# Patient Record
Sex: Male | Born: 1985 | Race: Black or African American | Hispanic: No | Marital: Single | Smoking: Never smoker
Health system: Southern US, Community
[De-identification: ages and names within clinical notes are randomized; demographics above are authoritative.]

## PROBLEM LIST (undated history)

## (undated) HISTORY — PX: MENISCUS REPAIR: SHX5179

---
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
## 2006-10-07 ENCOUNTER — Emergency Department: Payer: Self-pay | Admitting: Emergency Medicine

## 2009-08-24 ENCOUNTER — Emergency Department: Payer: Self-pay | Admitting: Emergency Medicine

## 2009-09-15 ENCOUNTER — Emergency Department: Payer: Self-pay | Admitting: Internal Medicine

## 2011-09-16 ENCOUNTER — Ambulatory Visit: Payer: Self-pay | Admitting: Surgery

## 2012-07-17 ENCOUNTER — Emergency Department: Payer: Self-pay | Admitting: Emergency Medicine

## 2016-06-05 ENCOUNTER — Encounter: Payer: Self-pay | Admitting: Emergency Medicine

## 2016-06-05 ENCOUNTER — Emergency Department
Admission: EM | Admit: 2016-06-05 | Discharge: 2016-06-05 | Disposition: A | Discharge status: Home / Self Care | Payer: Self-pay | Attending: Emergency Medicine | Admitting: Emergency Medicine

## 2016-06-05 DIAGNOSIS — T1490XD Injury, unspecified, subsequent encounter: Secondary | ICD-10-CM

## 2016-06-05 DIAGNOSIS — R234 Changes in skin texture: Secondary | ICD-10-CM | POA: Insufficient documentation

## 2016-06-05 DIAGNOSIS — Y999 Unspecified external cause status: Secondary | ICD-10-CM | POA: Insufficient documentation

## 2016-06-05 DIAGNOSIS — Y929 Unspecified place or not applicable: Secondary | ICD-10-CM | POA: Insufficient documentation

## 2016-06-05 DIAGNOSIS — X58XXXA Exposure to other specified factors, initial encounter: Secondary | ICD-10-CM | POA: Insufficient documentation

## 2016-06-05 DIAGNOSIS — S41102A Unspecified open wound of left upper arm, initial encounter: Secondary | ICD-10-CM | POA: Insufficient documentation

## 2016-06-05 DIAGNOSIS — Y939 Activity, unspecified: Secondary | ICD-10-CM | POA: Insufficient documentation

## 2016-06-05 NOTE — ED Triage Notes (Signed)
States has a scab on his shoulder that he doesn't know how it appeared but is still sore.

## 2016-06-05 NOTE — Discharge Instructions (Signed)
Continue to monitor your healing wound, and follow up with her primary care physician for the same. You may shower, but do not soak this wound. Do not pick at the scab. You may apply Neosporin or any triple antibiotic cream and a thick coat 3 times daily to help with healing and prevent infection.  Return to the emergency department if you develop fever, pus drainage, increased swelling or redness, or any other symptoms concerning to you.

## 2016-06-05 NOTE — ED Provider Notes (Signed)
[facility] Emergency Department Provider Note  ____________________________________________  Time seen: Approximately 7:12 PM  I have reviewed the triage vital signs and the nursing notes.   HISTORY  Chief Complaint Shoulder Pain    HPI Gregory Trujillo is a 30 y.o. male , otherwise healthy, nondiabetic and nonimmunocompromised, who denies IV drug abuse, presenting for slowly healing wound on the posterior aspect of the left upper arm. The patient reports that for the last 2 weeks, he has had a wound with a central black scab that has not healed. No surrounding erythema, significant tenderness, or warmth. No systemic symptoms including fever, chills, nausea or vomiting.The patient has not tried any creams or other treatments for his wound. He does not know how he got it.   No past medical history on file.  There are no active problems to display for this patient.   No past surgical history on file.    Allergies Review of patient's allergies indicates no known allergies.  No family history on file.  Social History Social History  Substance Use Topics  . Smoking status: Never Smoker  . Smokeless tobacco: Never Used  . Alcohol use Yes     Comment: weekly    Review of Systems Constitutional: No fever/chills. ENT:  No congestion or rhinorrhea. Respiratory: Denies shortness of breath.   Gastrointestinal:   No nausea, no vomiting.  No diarrhea.  No constipation. Musculoskeletal: Negative for left shoulder or left elbow pain. Skin: Positive for open wound in the left upper extremity. Neurological: Negative for headaches. No focal numbness, tingling or weakness.   10-point ROS otherwise negative.  ____________________________________________   PHYSICAL EXAM:  VITAL SIGNS: ED Triage Vitals  Enc Vitals Group     BP 06/05/16 1743 135/85     Pulse Rate 06/05/16 1743 70     Resp 06/05/16 1743 16     Temp 06/05/16 1743 98.5 F (36.9 C)   Temp Source 06/05/16 1743 Oral     SpO2 06/05/16 1743 97 %     Weight 06/05/16 1743 205 lb (93 kg)     Height 06/05/16 1743 6' (1.829 m)     Head Circumference --      Peak Flow --      Pain Score 06/05/16 1749 0     Pain Loc --      Pain Edu? --      Excl. in GC? --     Constitutional: Alert and oriented. Well appearing and in no acute distress. Answers questions appropriately. Eyes: Conjunctivae are normal.  EOMI. No scleral icterus. Head: Atraumatic. Nose: No congestion/rhinnorhea. Mouth/Throat: Mucous membranes are moist.  Neck: No stridor.  Supple.   Cardiovascular: Normal rate Respiratory: Normal respiratory effort. Musculoskeletal: Moves all extremities well  Neurologic:  A&Ox3.  Speech is clear.  Face and smile are symmetric.  EOMI.  Moves all extremities well. Skin: In the upper left upper extremity near the axilla on the posterior side, the patient has a 2 x 2 centimeter circular wound that has a central eschar with surrounding yellow granulation tissue. There is no surrounding erythema, swelling, or palpable fluctuance. There is no lymphatic streaking.  Psychiatric: Mood and affect are normal. Speech and behavior are normal.  Normal judgement.  ____________________________________________   LABS (all labs ordered are listed, but only abnormal results are displayed)  Labs Reviewed - No data to display ____________________________________________  EKG  Not indicated ____________________________________________  RADIOLOGY  No results found.  ____________________________________________   PROCEDURES  Procedure(s) performed: None  Procedures  Critical Care performed: No ____________________________________________   INITIAL IMPRESSION / ASSESSMENT AND PLAN / ED COURSE  Pertinent labs & imaging results that were available during my care of the patient were reviewed by me and considered in my medical decision making (see chart for details).  30 y.o. male  who is not immunocompromised presenting with a slowly healing wound on the posterior left arm. Overall, the patient does not have any systemic symptoms and is hemodynamically stable. His wound has a central eschar in granulation tissue, both of which are signs that is well healing. There is no evidence of surrounding cellulitis, nor any evidence of underlying abscess. The patient will be discharged home with instructions for wound care, as well as close PMD follow-up. Return precautions as well as follow-up instructions were discussed.  ____________________________________________  FINAL CLINICAL IMPRESSION(S) / ED DIAGNOSES  Final diagnoses:  Healing wound  Eschar    Clinical Course      NEW MEDICATIONS STARTED DURING THIS VISIT:  New Prescriptions   No medications on file      Rockne Menghini, MD 06/05/16 1916

## 2018-04-25 LAB — HM HIV SCREENING LAB: HM HIV Screening: NEGATIVE

## 2018-07-10 ENCOUNTER — Other Ambulatory Visit: Payer: Self-pay

## 2018-07-10 ENCOUNTER — Ambulatory Visit
Admission: EM | Admit: 2018-07-10 | Discharge: 2018-07-10 | Disposition: A | Discharge status: Home / Self Care | Payer: Self-pay | Attending: Family Medicine | Admitting: Family Medicine

## 2018-07-10 ENCOUNTER — Encounter: Payer: Self-pay | Admitting: Emergency Medicine

## 2018-07-10 DIAGNOSIS — B360 Pityriasis versicolor: Secondary | ICD-10-CM

## 2018-07-10 MED ORDER — FLUCONAZOLE 150 MG PO TABS: 300.0000 mg | ORAL_TABLET | ORAL | 0 refills | Status: DC

## 2018-07-10 NOTE — ED Triage Notes (Signed)
Pt c/o skin discoloration on his neck and left forearm. He reports that it has been on his arm for months but his neck recently started. He reports that it is mildly itchy.

## 2018-07-10 NOTE — ED Provider Notes (Signed)
[facility]    CSN: [id number] Arrival date & time: 07/10/18  0940  History   Chief Complaint Chief Complaint  Patient presents with  . Skin Discoloration   HPI  32 year old male presents with rash/skin discoloration.  Patient states that this rash/skin coloration has come about in the past month.  Affects the arms and neck.  Patient has areas that are hypopigmented as well as areas that are hyperpigmented.  No real itching.  No pain.  Is troublesome from a cosmetic perspective.  No medications or interventions tried.  No new exposures.  No other associated symptoms.  No other complaints.  PMH, Surgical Hx, Family Hx, Social History reviewed and updated as below.  PMH: Hx of knee injury.   Past Surgical History:  Procedure Laterality Date  . MENISCUS REPAIR Left    Home Medications    Prior to Admission medications   Medication Sig Start Date End Date Taking? Authorizing Provider  fluconazole (DIFLUCAN) 150 MG tablet Take 2 tablets (300 mg total) by mouth once a week. For 2 weeks. 07/10/18   Tommie Sams, DO    Family History Family History  Problem Relation Age of Onset  . Healthy Mother   . Healthy Father     Social History Social History   Tobacco Use  . Smoking status: Never Smoker  . Smokeless tobacco: Never Used  Substance Use Topics  . Alcohol use: Yes    Comment: weekly  . Drug use: Never     Allergies   Patient has no known allergies.   Review of Systems Review of Systems  Constitutional: Negative.   Skin: Positive for rash.   Physical Exam Triage Vital Signs ED Triage Vitals  Enc Vitals Group     BP 07/10/18 0955 124/81     Pulse Rate 07/10/18 0955 77     Resp 07/10/18 0955 17     Temp 07/10/18 0955 98.8 F (37.1 C)     Temp Source 07/10/18 0955 Oral     SpO2 07/10/18 0955 100 %     Weight 07/10/18 0952 230 lb (104.3 kg)     Height 07/10/18 0952 6' (1.829 m)     Head Circumference --      Peak Flow --      Pain Score  07/10/18 0952 0     Pain Loc --      Pain Edu? --      Excl. in GC? --    Updated Vital Signs BP 124/81 (BP Location: Left Arm)   Pulse 77   Temp 98.8 F (37.1 C) (Oral)   Resp 17   Ht 6' (1.829 m)   Wt 104.3 kg   SpO2 100%   BMI 31.19 kg/m   Visual Acuity Right Eye Distance:   Left Eye Distance:   Bilateral Distance:    Right Eye Near:   Left Eye Near:    Bilateral Near:     Physical Exam  Constitutional: He is oriented to person, place, and time. He appears well-developed. No distress.  HENT:  Head: Normocephalic and atraumatic.  Pulmonary/Chest: Effort normal. No respiratory distress.  Neurological: He is alert and oriented to person, place, and time.  Skin:  Areas of hypopigmentation and hyperpigmentation noted.  See picture.  Psychiatric: He has a normal mood and affect. His behavior is normal.  Nursing note and vitals reviewed.      UC Treatments / Results  Labs (all labs ordered are listed, but only  abnormal results are displayed) Labs Reviewed - No data to display  EKG None  Radiology No results found.  Procedures Procedures (including critical care time)  Medications Ordered in UC Medications - No data to display  Initial Impression / Assessment and Plan / UC Course  I have reviewed the triage vital signs and the nursing notes.  Pertinent labs & imaging results that were available during my care of the patient were reviewed by me and considered in my medical decision making (see chart for details).    32 year old male presents with rash.  Suspect tinea versicolor.  Treating with fluconazole. Final Clinical Impressions(s) / UC Diagnoses   Final diagnoses:  Tinea versicolor     Discharge Instructions     If persists or fails to resolve, see Dermatology.  Take care  Dr. Adriana Simas    ED Prescriptions    Medication Sig Dispense Auth. Provider   fluconazole (DIFLUCAN) 150 MG tablet Take 2 tablets (300 mg total) by mouth once a week. For  2 weeks. 4 tablet Tommie Sams, DO     Controlled Substance Prescriptions [place] Controlled Substance Registry consulted? Not Applicable   Tommie Sams, DO 07/10/18 1141

## 2018-07-10 NOTE — Discharge Instructions (Signed)
If persists or fails to resolve, see Dermatology.  Take care  Dr. Adriana Simas

## 2018-12-30 ENCOUNTER — Emergency Department
Admission: EM | Admit: 2018-12-30 | Discharge: 2018-12-30 | Disposition: A | Discharge status: Home / Self Care | Payer: No Typology Code available for payment source | Attending: Emergency Medicine | Admitting: Emergency Medicine

## 2018-12-30 ENCOUNTER — Other Ambulatory Visit: Payer: Self-pay

## 2018-12-30 ENCOUNTER — Emergency Department: Payer: No Typology Code available for payment source

## 2018-12-30 ENCOUNTER — Encounter: Payer: Self-pay | Admitting: Emergency Medicine

## 2018-12-30 DIAGNOSIS — Y92410 Unspecified street and highway as the place of occurrence of the external cause: Secondary | ICD-10-CM | POA: Insufficient documentation

## 2018-12-30 DIAGNOSIS — S299XXA Unspecified injury of thorax, initial encounter: Secondary | ICD-10-CM | POA: Diagnosis not present

## 2018-12-30 DIAGNOSIS — Y998 Other external cause status: Secondary | ICD-10-CM | POA: Diagnosis not present

## 2018-12-30 DIAGNOSIS — S3992XA Unspecified injury of lower back, initial encounter: Secondary | ICD-10-CM | POA: Insufficient documentation

## 2018-12-30 DIAGNOSIS — Y93I9 Activity, other involving external motion: Secondary | ICD-10-CM | POA: Insufficient documentation

## 2018-12-30 MED ORDER — MELOXICAM 15 MG PO TABS: 15.0000 mg | ORAL_TABLET | Freq: Every day | ORAL | 1 refills | Status: AC

## 2018-12-30 MED ORDER — METHOCARBAMOL 500 MG PO TABS: 500.0000 mg | ORAL_TABLET | Freq: Three times a day (TID) | ORAL | 0 refills | Status: AC | PRN
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
## 2018-12-30 NOTE — ED Notes (Signed)
Discharge instructions reviewed with patient. Questions fielded by this RN. Patient verbalizes understanding of instructions. Patient discharged home in stable condition per [facility]. No acute distress noted at time of discharge.   No peripheral IV placed this visit.

## 2019-05-06 ENCOUNTER — Ambulatory Visit: Payer: Self-pay

## 2019-05-09 ENCOUNTER — Ambulatory Visit: Payer: Self-pay | Admitting: Physician Assistant

## 2019-05-09 ENCOUNTER — Other Ambulatory Visit: Payer: Self-pay

## 2019-05-09 ENCOUNTER — Encounter: Payer: Self-pay | Admitting: Physician Assistant

## 2019-05-09 DIAGNOSIS — Z113 Encounter for screening for infections with a predominantly sexual mode of transmission: Secondary | ICD-10-CM

## 2019-05-09 LAB — GRAM STAIN

## 2019-05-09 NOTE — Progress Notes (Signed)
    STI clinic/screening visit  Subjective:  Gregory Trujillo is a 33 y.o. male being seen today for an STI screening visit. The patient reports they do not have symptoms.  Patient has the following medical conditions:  There are no active problems to display for this patient.    Chief Complaint  Patient presents with  . SEXUALLY TRANSMITTED DISEASE    HPI  Patient reports that he would like a screening today.  Denies any symptoms.  See flowsheet for further details and programmatic requirements.    The following portions of the patient's history were reviewed and updated as appropriate: allergies, current medications, past medical history, past social history, past surgical history and problem list.  Objective:  There were no vitals filed for this visit.  Physical Exam Constitutional:      General: He is not in acute distress.    Appearance: Normal appearance.  HENT:     Head: Normocephalic and atraumatic.     Mouth/Throat:     Mouth: Mucous membranes are moist.     Pharynx: Oropharynx is clear. No oropharyngeal exudate or posterior oropharyngeal erythema.  Eyes:     Conjunctiva/sclera: Conjunctivae normal.  Neck:     Musculoskeletal: Neck supple.  Pulmonary:     Effort: Pulmonary effort is normal.  Abdominal:     Palpations: Abdomen is soft. There is no mass.     Tenderness: There is no abdominal tenderness. There is no guarding or rebound.  Genitourinary:    Penis: Normal.      Scrotum/Testes: Normal.     Comments: Pubic area without nits, lice, edema, erythema, lesions and inguinal adenopathy. Penis uncircumcised and without any discharge at meatus. Lymphadenopathy:     Cervical: No cervical adenopathy.  Skin:    General: Skin is warm and dry.     Findings: No bruising, erythema, lesion or rash.  Neurological:     Mental Status: He is alert and oriented to person, place, and time.  Psychiatric:        Mood and Affect: Mood normal.        Behavior:  Behavior normal.        Thought Content: Thought content normal.        Judgment: Judgment normal.       Assessment and Plan:  Gregory Trujillo is a 33 y.o. male presenting to the [facility] Department for STI screening  1. Screening for STD (sexually transmitted disease) Patient is without any symptoms today.  Gram stain is negative and no treatment is indicated.  Rec condoms with all sex Await test results.  Counseled that RN will call if needs to RTC for any treatment once results are back.  - Gram stain - Gonococcus culture - HIV [place] [facility] - Syphilis Serology, [place] [facility] - Gonococcus culture     No follow-ups on file.  No future appointments.  Jerene Dilling, PA

## 2019-05-13 LAB — GONOCOCCUS CULTURE

## 2019-05-30 ENCOUNTER — Other Ambulatory Visit: Payer: Self-pay

## 2019-05-30 DIAGNOSIS — Z20822 Contact with and (suspected) exposure to covid-19: Secondary | ICD-10-CM

## 2019-05-31 LAB — NOVEL CORONAVIRUS, NAA: SARS-CoV-2, NAA: NOT DETECTED

## 2019-08-14 ENCOUNTER — Other Ambulatory Visit: Payer: Self-pay | Admitting: *Deleted

## 2019-08-14 DIAGNOSIS — Z20822 Contact with and (suspected) exposure to covid-19: Secondary | ICD-10-CM

## 2019-08-15 LAB — NOVEL CORONAVIRUS, NAA: SARS-CoV-2, NAA: NOT DETECTED

## 2019-11-19 ENCOUNTER — Other Ambulatory Visit: Payer: Self-pay

## 2019-11-19 ENCOUNTER — Ambulatory Visit: Payer: Self-pay | Admitting: Physician Assistant

## 2019-11-19 DIAGNOSIS — Z113 Encounter for screening for infections with a predominantly sexual mode of transmission: Secondary | ICD-10-CM

## 2019-11-19 LAB — GRAM STAIN

## 2019-11-20 ENCOUNTER — Encounter: Payer: Self-pay | Admitting: Physician Assistant

## 2019-11-20 NOTE — Progress Notes (Signed)
   [facility] Department STI clinic/screening visit  Subjective:  Gregory Trujillo is a 34 y.o. male being seen today for an STI screening visit. The patient reports they do not have symptoms.    Patient has the following medical conditions:  There are no problems to display for this patient.    Chief Complaint  Patient presents with  . SEXUALLY TRANSMITTED DISEASE    HPI  Patient reports that he does not have any symptoms but would like a screening today.  Using condoms sometimes.   See flowsheet for further details and programmatic requirements.    The following portions of the patient's history were reviewed and updated as appropriate: allergies, current medications, past medical history, past social history, past surgical history and problem list.  Objective:  There were no vitals filed for this visit.  Physical Exam Constitutional:      General: He is not in acute distress.    Appearance: Normal appearance.  HENT:     Head: Normocephalic and atraumatic.     Comments: No nits, lice, or hair loss. No cervical, supraclavicular or axillary adenopathy.    Mouth/Throat:     Mouth: Mucous membranes are moist.     Pharynx: Oropharynx is clear. No oropharyngeal exudate or posterior oropharyngeal erythema.  Eyes:     Conjunctiva/sclera: Conjunctivae normal.  Pulmonary:     Effort: Pulmonary effort is normal.  Abdominal:     Palpations: Abdomen is soft. There is no mass.     Tenderness: There is no abdominal tenderness. There is no guarding or rebound.  Genitourinary:    Penis: Normal.      Testes: Normal.     Comments: Pubic area without nits, lice, edema, erythema, lesions and inguinal adenopathy. Penis circumcised, without rash or lesions and discharge at meatus. Musculoskeletal:     Cervical back: Neck supple. No tenderness.  Skin:    General: Skin is warm and dry.     Findings: No bruising, erythema, lesion or rash.  Neurological:     Mental  Status: He is alert and oriented to person, place, and time.  Psychiatric:        Mood and Affect: Mood normal.        Behavior: Behavior normal.        Thought Content: Thought content normal.        Judgment: Judgment normal.       Assessment and Plan:  Gregory Trujillo is a 34 y.o. male presenting to the [facility] Department for STI screening  1. Screening for STD (sexually transmitted disease) Patient into clinic without symptoms. Rec condoms with all sex. Await test results.  Counseled that RN will call if needs to RTC for treatment once results are back. - Gram stain - Gonococcus culture - HIV [place] [facility] - Syphilis Serology, [facility]     No follow-ups on file.  No future appointments.  Matt Holmes, PA

## 2019-11-23 LAB — GONOCOCCUS CULTURE

## 2020-03-24 ENCOUNTER — Ambulatory Visit: Payer: Self-pay | Admitting: Physician Assistant

## 2020-03-24 ENCOUNTER — Other Ambulatory Visit: Payer: Self-pay

## 2020-03-24 DIAGNOSIS — Z113 Encounter for screening for infections with a predominantly sexual mode of transmission: Secondary | ICD-10-CM

## 2020-03-24 LAB — GRAM STAIN

## 2020-03-25 ENCOUNTER — Encounter: Payer: Self-pay | Admitting: Physician Assistant

## 2020-03-25 NOTE — Progress Notes (Signed)
   [facility] Department STI clinic/screening visit  Subjective:  Gregory Trujillo is a 34 y.o. male being seen today for an STI screening visit. The patient reports they do not have symptoms.    Patient has the following medical conditions:  There are no problems to display for this patient.    Chief Complaint  Patient presents with  . SEXUALLY TRANSMITTED DISEASE    screening    HPI  Patient reports that he does not have any symptoms but would like a screening today.  Denies chronic conditions, surgeries and regular medicines.  States last HIV testing was in March of this year.   See flowsheet for further details and programmatic requirements.    The following portions of the patient's history were reviewed and updated as appropriate: allergies, current medications, past medical history, past social history, past surgical history and problem list.  Objective:  There were no vitals filed for this visit.  Physical Exam Constitutional:      General: He is not in acute distress.    Appearance: Normal appearance.  HENT:     Head: Normocephalic and atraumatic.     Comments: No nits, lice, or hair loss. No cervical, supraclavicular or axillary adenopathy.    Mouth/Throat:     Mouth: Mucous membranes are moist.     Pharynx: Oropharynx is clear. No oropharyngeal exudate or posterior oropharyngeal erythema.  Eyes:     Conjunctiva/sclera: Conjunctivae normal.  Pulmonary:     Effort: Pulmonary effort is normal.  Abdominal:     Palpations: Abdomen is soft. There is no mass.     Tenderness: There is no abdominal tenderness. There is no guarding or rebound.  Genitourinary:    Penis: Normal.      Testes: Normal.     Comments: Pubic area without nits, lice, edema, erythema, lesions and inguinal adenopathy. Penis circumcised without rash, lesion and discharge at meatus. Musculoskeletal:     Cervical back: Neck supple. No tenderness.  Skin:    General: Skin is warm  and dry.     Findings: No bruising, erythema, lesion or rash.  Neurological:     Mental Status: He is alert and oriented to person, place, and time.  Psychiatric:        Mood and Affect: Mood normal.        Behavior: Behavior normal.        Thought Content: Thought content normal.        Judgment: Judgment normal.       Assessment and Plan:  Gregory Trujillo is a 33 y.o. male presenting to the [facility] Department for STI screening  1. Screening for STD (sexually transmitted disease) Patient into clinic without symptoms.  Reviewed exam and Gram stain findings with patient and no indication for treatment today.  Rec condoms with all sex. Await test results.  Counseled that RN will call if needs to RTC for treatment once results are back. - Gram stain - Gonococcus culture - HIV/HCV [facility] - Syphilis Serology, [facility] - Gonococcus culture     No follow-ups on file.  No future appointments.  Matt Holmes, PA

## 2020-03-27 LAB — HM HEPATITIS C SCREENING LAB: HM Hepatitis Screen: NEGATIVE

## 2020-03-27 LAB — HM HIV SCREENING LAB: HM HIV Screening: NEGATIVE

## 2020-03-29 LAB — GONOCOCCUS CULTURE

## 2020-03-31 ENCOUNTER — Encounter: Payer: Self-pay | Admitting: Family Medicine

## 2020-06-14 ENCOUNTER — Emergency Department
Admission: EM | Admit: 2020-06-14 | Discharge: 2020-06-14 | Disposition: A | Discharge status: Home / Self Care | Payer: Self-pay | Attending: Emergency Medicine | Admitting: Emergency Medicine

## 2020-06-14 ENCOUNTER — Encounter: Payer: Self-pay | Admitting: Emergency Medicine

## 2020-06-14 ENCOUNTER — Other Ambulatory Visit: Payer: Self-pay

## 2020-06-14 DIAGNOSIS — B360 Pityriasis versicolor: Secondary | ICD-10-CM | POA: Insufficient documentation

## 2020-06-14 DIAGNOSIS — K2921 Alcoholic gastritis with bleeding: Secondary | ICD-10-CM | POA: Insufficient documentation

## 2020-06-14 LAB — COMPREHENSIVE METABOLIC PANEL
ALT: 30 U/L (ref 0–44)
AST: 23 U/L (ref 15–41)
Albumin: 4.3 g/dL (ref 3.5–5.0)
Alkaline Phosphatase: 44 U/L (ref 38–126)
Anion gap: 11 (ref 5–15)
BUN: 14 mg/dL (ref 6–20)
CO2: 26 mmol/L (ref 22–32)
Calcium: 9.4 mg/dL (ref 8.9–10.3)
Chloride: 101 mmol/L (ref 98–111)
Creatinine, Ser: 1.19 mg/dL (ref 0.61–1.24)
GFR, Estimated: >60 mL/min (ref >60)
Glucose, Bld: 99 mg/dL (ref 70–99)
Potassium: 3.6 mmol/L (ref 3.5–5.1)
Sodium: 138 mmol/L (ref 135–145)
Total Bilirubin: 1.2 mg/dL (ref 0.3–1.2)
Total Protein: 7.4 g/dL (ref 6.5–8.1)

## 2020-06-14 LAB — CBC
HCT: 47.6 % (ref 39.0–52.0)
Hemoglobin: 16.8 g/dL (ref 13.0–17.0)
MCH: 28.5 pg (ref 26.0–34.0)
MCHC: 35.3 g/dL (ref 30.0–36.0)
MCV: 80.7 fL (ref 80.0–100.0)
Platelets: 204 10*3/uL (ref 150–400)
RBC: 5.9 MIL/uL — ABNORMAL HIGH (ref 4.22–5.81)
RDW: 13.8 % (ref 11.5–15.5)
WBC: 6.6 10*3/uL (ref 4.0–10.5)
nRBC: 0 % (ref 0.0–0.2)

## 2020-06-14 LAB — URINALYSIS, COMPLETE (UACMP) WITH MICROSCOPIC
Bacteria, UA: NONE SEEN
Bilirubin Urine: NEGATIVE
Glucose, UA: NEGATIVE mg/dL
Hgb urine dipstick: NEGATIVE
Ketones, ur: NEGATIVE mg/dL
Leukocytes,Ua: NEGATIVE
Nitrite: NEGATIVE
Protein, ur: NEGATIVE mg/dL
Specific Gravity, Urine: 1.03 (ref 1.005–1.030)
Squamous Epithelial / HPF: NONE SEEN (ref 0–5)
pH: 5 (ref 5.0–8.0)

## 2020-06-14 LAB — LIPASE, BLOOD: Lipase: 27 U/L (ref 11–51)

## 2020-06-14 MED ORDER — DICYCLOMINE HCL 10 MG PO CAPS: 10.0000 mg | ORAL_CAPSULE | Freq: Four times a day (QID) | ORAL | 0 refills | Status: DC

## 2020-06-14 MED ORDER — OMEPRAZOLE 10 MG PO CPDR: 10.0000 mg | DELAYED_RELEASE_CAPSULE | Freq: Every day | ORAL | 0 refills | Status: DC

## 2020-06-14 MED ORDER — FLUCONAZOLE 150 MG PO TABS: 150.0000 mg | ORAL_TABLET | ORAL | 0 refills | Status: DC

## 2020-06-14 MED ORDER — KETOCONAZOLE 2 % EX CREA: 1.0000 "application " | TOPICAL_CREAM | Freq: Every day | CUTANEOUS | 1 refills | Status: AC

## 2020-06-14 NOTE — ED Triage Notes (Signed)
Pt presents to ED via POV, pt states has been having diarrhea and dark blood in his stools since Friday after having several drinks. Pt also states that he believes he is allergic to something due to having a rash on his skin, pt with dark brown dry patches on his arms. Pt A&O x4, VSS in triage.

## 2020-06-14 NOTE — ED Provider Notes (Signed)
[facility] Emergency Department Provider Note  ____________________________________________  Time seen: Approximately 7:34 PM  I have reviewed the triage vital signs and the nursing notes.   HISTORY  Chief Complaint Abdominal Pain and Rash    HPI Gregory Trujillo is a 34 y.o. male who presents the emergency department complaining of 2 separate complaints.  Patient's primary complaint is diarrhea, specks of blood in his stool.  Patient states that he drinks "a lot" of liquor.  Patient states that he had an exceptionally heavy night of drinking 2 days ago.  He has had some upset stomach, diarrhea and seen some "specks" of blood in his stool.  No frank bleeding.  Patient denies any significant abdominal pain.  Patient was concerned after seeing the blood.  No history of GI bleeding.  No history of gastritis or ulcers.  No bleeding or clotting disorders.  No fevers or chills.  No emesis.  Patient is also complaining of return of tinea versicolor.  Patient states that 2 years he was treated successfully with Diflucan.  He states that he has had a return of the rash to his neck, bilateral arms.  Patient is requesting medication to clear up the rash.         History reviewed. No pertinent past medical history.  There are no problems to display for this patient.   Past Surgical History:  Procedure Laterality Date  . MENISCUS REPAIR Left     Prior to Admission medications   Medication Sig Start Date End Date Taking? Authorizing Provider  dicyclomine (BENTYL) 10 MG capsule Take 1 capsule (10 mg total) by mouth 4 (four) times daily for 14 days. 06/14/20 06/28/20  Madysen Faircloth, Delorise Royals, PA-C  fluconazole (DIFLUCAN) 150 MG tablet Take 1 tablet (150 mg total) by mouth once a week. 06/14/20   Adhvik Canady, Delorise Royals, PA-C  ketoconazole (NIZORAL) 2 % cream Apply 1 application topically daily for 21 days. 06/14/20 07/05/20  Ghassan Coggeshall, Delorise Royals, PA-C  omeprazole  (PRILOSEC) 10 MG capsule Take 1 capsule (10 mg total) by mouth daily. 06/14/20   Brisia Schuermann, Delorise Royals, PA-C    Allergies Patient has no known allergies.  Family History  Problem Relation Age of Onset  . Healthy Mother   . Healthy Father     Social History Social History   Tobacco Use  . Smoking status: Never Smoker  . Smokeless tobacco: Never Used  Vaping Use  . Vaping Use: Never used  Substance Use Topics  . Alcohol use: Yes    Comment: weekly  . Drug use: Never     Review of Systems  Constitutional: No fever/chills Eyes: No visual changes. No discharge ENT: No upper respiratory complaints. Cardiovascular: no chest pain. Respiratory: no cough. No SOB. Gastrointestinal: No abdominal pain.  No nausea, no vomiting.  Positive for diarrhea with specks of blood..  No constipation. Musculoskeletal: Negative for musculoskeletal pain. Skin: Positive for return of tinea versicolor Neurological: Negative for headaches, focal weakness or numbness. 10-point ROS otherwise negative.  ____________________________________________   PHYSICAL EXAM:  VITAL SIGNS: ED Triage Vitals [06/14/20 1623]  Enc Vitals Group     BP 132/89     Pulse Rate 74     Resp 20     Temp 98.4 F (36.9 C)     Temp Source Oral     SpO2 99 %     Weight 240 lb (108.9 kg)     Height 6' (1.829 m)     Head Circumference  Peak Flow      Pain Score 8     Pain Loc      Pain Edu?      Excl. in GC?      Constitutional: Alert and oriented. Well appearing and in no acute distress. Eyes: Conjunctivae are normal. PERRL. EOMI. Head: Atraumatic. ENT:      Ears:       Nose: No congestion/rhinnorhea.      Mouth/Throat: Mucous membranes are moist.  Neck: No stridor.    Cardiovascular: Normal rate, regular rhythm. Normal S1 and S2.  Good peripheral circulation. Respiratory: Normal respiratory effort without tachypnea or retractions. Lungs CTAB. Good air entry to the bases with no decreased or absent  breath sounds. Gastrointestinal: No external abdominal wall findings.  Bowel sounds 4 quadrants all quadrants.  . Soft and nontender to palpation. No tenderness specifically in the epigastric region.No guarding or rigidity. No palpable masses. No distention. No CVA tenderness. Musculoskeletal: Full range of motion to all extremities. No gross deformities appreciated. Neurologic:  Normal speech and language. No gross focal neurologic deficits are appreciated.  Skin:  Skin is warm, dry and intact.  Patient has a dry, patchy rash consistent with tinea versicolor noted to bilateral arms, posterior neck.  No erythema or edema concerning for cellulitis.  No fluctuance concerning for abscess.  No hives or urticaria. Psychiatric: Mood and affect are normal. Speech and behavior are normal. Patient exhibits appropriate insight and judgement.   ____________________________________________   LABS (all labs ordered are listed, but only abnormal results are displayed)  Labs Reviewed  CBC - Abnormal; Notable for the following components:      Result Value   RBC 5.90 (*)    All other components within normal limits  URINALYSIS, COMPLETE (UACMP) WITH MICROSCOPIC - Abnormal; Notable for the following components:   Color, Urine YELLOW (*)    APPearance CLEAR (*)    All other components within normal limits  LIPASE, BLOOD  COMPREHENSIVE METABOLIC PANEL   ____________________________________________  EKG   ____________________________________________  RADIOLOGY   No results found.  ____________________________________________    PROCEDURES  Procedure(s) performed:    Procedures    Medications - No data to display   ____________________________________________   INITIAL IMPRESSION / ASSESSMENT AND PLAN / ED COURSE  Pertinent labs & imaging results that were available during my care of the patient were reviewed by me and considered in my medical decision making (see chart for  details).  Review of the [place] CSRS was performed in accordance of the [facility] prior to dispensing any controlled drugs.           Patient's diagnosis is consistent with tinea versicolor, alcoholic gastritis.  Patient presented to the emergency department 2 separate complaints.  Patient has had diarrhea, specks of blood after drinking large amount of alcohol 2 days ago.  Labs are reassuring.  Abdominal exam is benign with no significant tenderness.  I have a low suspicion for perforation or ulcers at this time.  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  At this time no indication for imaging.  I will treat the patient with Bentyl and omeprazole for symptom relief.  I have encouraged the patient to decrease alcohol consumption for well to allow improvement of symptoms.  Patient developed sudden abdominal pain, frank GI bleeding he should return to the emergency department for reevaluation.  Patient has findings consistent with tinea versicolor as well.  Patient has been treated in  the past.  Treat with ketoconazole topical as well as weekly Diflucan for 6 weeks.  Follow-up with dermatology if symptoms do not improve.  Follow-up with GI if patient has ongoing GI complaints.. Patient is given ED precautions to return to the ED for any worsening or new symptoms.     ____________________________________________  FINAL CLINICAL IMPRESSION(S) / ED DIAGNOSES  Final diagnoses:  Acute alcoholic gastritis with hemorrhage  Tinea versicolor      NEW MEDICATIONS STARTED DURING THIS VISIT:  ED Discharge Orders         Ordered    omeprazole (PRILOSEC) 10 MG capsule  Daily        06/14/20 2055    dicyclomine (BENTYL) 10 MG capsule  4 times daily        06/14/20 2055    fluconazole (DIFLUCAN) 150 MG tablet  Weekly        06/14/20 2055    ketoconazole (NIZORAL) 2 % cream  Daily        06/14/20 2055              This chart was dictated  using voice recognition software/Dragon. Despite best efforts to proofread, errors can occur which can change the meaning. Any change was purely unintentional.    Racheal Patches, PA-C 06/14/20 2103    Phineas Semen, MD 06/14/20 2206

## 2020-06-15 ENCOUNTER — Encounter: Payer: Self-pay | Admitting: Family Medicine

## 2020-06-15 ENCOUNTER — Ambulatory Visit: Payer: Self-pay | Admitting: Family Medicine

## 2020-06-15 DIAGNOSIS — Z113 Encounter for screening for infections with a predominantly sexual mode of transmission: Secondary | ICD-10-CM

## 2020-06-15 LAB — GRAM STAIN

## 2020-06-15 NOTE — Progress Notes (Signed)
   [facility] Department STI clinic/screening visit  Subjective:  Gregory Trujillo is a 34 y.o. male being seen today for an STI screening visit. The patient reports they do not have symptoms.    Patient has the following medical conditions:  There are no problems to display for this patient.    Chief Complaint  Patient presents with  . SEXUALLY TRANSMITTED DISEASE    screening     HPI  Patient reports he does not have symptoms and just want a check up.  Denies any partner with symptoms.   See flowsheet for further details and programmatic requirements.   The following portions of the patient's history were reviewed and updated as appropriate: allergies, current medications, past medical history, past social history, past surgical history and problem list.  Objective:  There were no vitals filed for this visit.  Physical Exam HENT:     Head:     Comments: In scalp, brows and lashes: no nits, no hair loss    Mouth/Throat:     Mouth: Mucous membranes are moist.     Pharynx: Oropharynx is clear. No oropharyngeal exudate or posterior oropharyngeal erythema.  Genitourinary:    Comments: No lice, nits, or pest, no lesions or odor discharge.  Denies pain or tenderness with paplation of testicles.  No lesions, ulcers or masses present.   Musculoskeletal:     Cervical back: Normal range of motion and neck supple.  Lymphadenopathy:     Cervical: No cervical adenopathy.  Skin:    General: Skin is warm and dry.     Findings: No bruising, erythema, lesion or rash.     Comments: Patient has hyperpigmented areas on neck,in fold of arms, and back- being treated for tinea versicolor.   Psychiatric:        Mood and Affect: Mood normal.        Behavior: Behavior normal.       Assessment and Plan:  [name] is a 34 y.o. male presenting to the [facility] Department for STI screening  1. Screening examination for venereal disease - Gram stain -  Gonococcus culture - Gonococcus culture   Patient does not have STI symptoms Patient accepted all screenings including gram stain , GC for urethral and pharynx  and declines bloodwork today.   Patient meets criteria for HepB screening? No. Ordered? No - does not meet critera  Patient meets criteria for HepC screening? No. Ordered? No - does not meet critera  Recommended condom use with all sex Discussed importance of condom use for STI prevention, condoms declined today.    Per gram stain no treatment needed today.  Discussed time line for [facility] results and that patient will be called with positive results and encouraged patient to call if he had not heard in 2 weeks.  Recommended returning for continued or worsening symptoms.     No follow-ups on file.  No future appointments.  Wendi Snipes, RN

## 2020-06-17 NOTE — Progress Notes (Signed)
Reviewed ERRN documentation for flowsheet and exam note.  Agree that documentation meets STD program requirements/guidelines and standing orders.  Will sign sign since ERRN not cleared to sign documents yet. 

## 2020-06-20 LAB — GONOCOCCUS CULTURE

## 2020-08-24 ENCOUNTER — Other Ambulatory Visit: Payer: Self-pay

## 2020-08-24 DIAGNOSIS — Z20822 Contact with and (suspected) exposure to covid-19: Secondary | ICD-10-CM

## 2020-08-25 LAB — SARS-COV-2, NAA 2 DAY TAT

## 2020-08-25 LAB — NOVEL CORONAVIRUS, NAA: SARS-CoV-2, NAA: NOT DETECTED

## 2020-12-22 ENCOUNTER — Other Ambulatory Visit: Payer: Self-pay

## 2020-12-22 ENCOUNTER — Ambulatory Visit (INDEPENDENT_AMBULATORY_CARE_PROVIDER_SITE_OTHER): Payer: Self-pay

## 2020-12-22 ENCOUNTER — Ambulatory Visit
Admission: EM | Admit: 2020-12-22 | Discharge: 2020-12-22 | Disposition: A | Discharge status: Home / Self Care | Payer: Self-pay | Attending: Emergency Medicine | Admitting: Emergency Medicine

## 2020-12-22 DIAGNOSIS — M7541 Impingement syndrome of right shoulder: Secondary | ICD-10-CM

## 2020-12-22 DIAGNOSIS — M25511 Pain in right shoulder: Secondary | ICD-10-CM

## 2020-12-22 MED ORDER — PREDNISONE 10 MG (21) PO TBPK: ORAL_TABLET | ORAL | 0 refills | Status: AC
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
## 2020-12-22 NOTE — Discharge Instructions (Signed)
Starting tomorrow morning take the prednisone according to the package instructions to help with inflammation in your right shoulder.  Follow the rehabilitation exercises given in your discharge paperwork.  I have made a referral to [facility] and they will be calling you to schedule an appointment.
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
## 2020-12-29 ENCOUNTER — Ambulatory Visit: Payer: Self-pay | Admitting: Orthopaedic Surgery

## 2021-01-20 ENCOUNTER — Ambulatory Visit: Payer: Self-pay

## 2021-09-11 ENCOUNTER — Other Ambulatory Visit: Payer: Self-pay

## 2021-09-11 ENCOUNTER — Emergency Department
Admission: EM | Admit: 2021-09-11 | Discharge: 2021-09-11 | Disposition: A | Discharge status: Home / Self Care | Payer: Self-pay | Attending: Emergency Medicine | Admitting: Emergency Medicine

## 2021-09-11 ENCOUNTER — Emergency Department: Payer: Self-pay

## 2021-09-11 DIAGNOSIS — Z23 Encounter for immunization: Secondary | ICD-10-CM | POA: Insufficient documentation

## 2021-09-11 DIAGNOSIS — S62633B Displaced fracture of distal phalanx of left middle finger, initial encounter for open fracture: Secondary | ICD-10-CM | POA: Insufficient documentation

## 2021-09-11 DIAGNOSIS — W3400XA Accidental discharge from unspecified firearms or gun, initial encounter: Secondary | ICD-10-CM | POA: Insufficient documentation

## 2021-09-11 MED ORDER — BACITRACIN ZINC 500 UNIT/GM EX OINT: TOPICAL_OINTMENT | Freq: Once | CUTANEOUS | Status: AC

## 2021-09-11 MED ORDER — OXYCODONE-ACETAMINOPHEN 5-325 MG PO TABS: 2.0000 | ORAL_TABLET | Freq: Four times a day (QID) | ORAL | 0 refills | Status: AC | PRN

## 2021-09-11 MED ORDER — ONDANSETRON 4 MG PO TBDP
4.0000 mg | ORAL_TABLET | Freq: Once | ORAL | Status: AC
  Administered 2021-09-11: 4 mg via ORAL
  Filled 2021-09-11: qty 1

## 2021-09-11 MED ORDER — CEPHALEXIN 500 MG PO CAPS: 500.0000 mg | ORAL_CAPSULE | Freq: Four times a day (QID) | ORAL | 0 refills | Status: AC

## 2021-09-11 MED ORDER — TETANUS-DIPHTH-ACELL PERTUSSIS 5-2.5-18.5 LF-MCG/0.5 IM SUSY
0.5000 mL | PREFILLED_SYRINGE | Freq: Once | INTRAMUSCULAR | Status: AC
  Administered 2021-09-11: 0.5 mL via INTRAMUSCULAR
  Filled 2021-09-11: qty 0.5

## 2021-09-11 MED ORDER — CEPHALEXIN 500 MG PO CAPS
500.0000 mg | ORAL_CAPSULE | Freq: Once | ORAL | Status: AC
  Administered 2021-09-11: 500 mg via ORAL
  Filled 2021-09-11: qty 1

## 2021-09-11 MED ORDER — ONDANSETRON 4 MG PO TBDP: 4.0000 mg | ORAL_TABLET | Freq: Four times a day (QID) | ORAL | 0 refills | Status: AC | PRN

## 2021-09-11 MED ORDER — IBUPROFEN 800 MG PO TABS: 800.0000 mg | ORAL_TABLET | Freq: Three times a day (TID) | ORAL | 0 refills | Status: AC | PRN

## 2021-09-11 MED ORDER — OXYCODONE-ACETAMINOPHEN 5-325 MG PO TABS
2.0000 | ORAL_TABLET | Freq: Once | ORAL | Status: AC
  Administered 2021-09-11: 2 via ORAL
  Filled 2021-09-11: qty 2
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
## 2021-09-11 NOTE — Discharge Instructions (Addendum)
You will need to see orthopedic surgery as an outpatient for this injury.  Please keep your wound clean and dry and a dressing on at all times.  Unfortunately at this time there is no tissue that can be sutured from the ED.  We did discuss your case with Dr. Roland Rack on call with orthopedics who is reviewed pictures of your injury as well as x-rays and recommends close outpatient follow-up, antibiotics, pain control.  He recommends very close follow-up with a hand surgeon.  I have provided you with information for hand surgeons in [place], [place] and [place].  If you begin having fever of 100.4 or higher, uncontrolled pain, bleeding from your wound that will not stop with holding direct pressure for 30 minutes, drainage of pus, redness that streaks up your hand and arm, any other symptom concerning to you, please return to the emergency department.   You are being provided a prescription for opiates (also known as narcotics) for pain control.  Opiates can be addictive and should only be used when absolutely necessary for pain control when other alternatives do not work.  We recommend you only use them for the recommended amount of time and only as prescribed.  Please do not take with other sedative medications or alcohol.  Please do not drive, operate machinery, make important decisions while taking opiates.  Please note that these medications can be addictive and have high abuse potential.  Patients can become addicted to narcotics after only taking them for a few days.  Please keep these medications locked away from children, teenagers or any family members with history of substance abuse.  Narcotic pain medicine may also make you constipated.  You may use over-the-counter medications such as MiraLAX, Colace to prevent constipation.  If you become constipated you may use over-the-counter enemas as needed.  Itching and nausea are common side effects of narcotic pain medication.  If you develop uncontrolled  vomiting or a rash, please stop these medications.
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
## 2021-09-11 NOTE — ED Notes (Signed)
Antibiotic oint, non stick dressing applied with bulky gauze over nonsitck.

## 2021-09-11 NOTE — ED Notes (Signed)
BPD here to speak with pt.

## 2021-09-27 ENCOUNTER — Ambulatory Visit: Payer: Self-pay

## 2022-05-19 ENCOUNTER — Emergency Department: Payer: Self-pay

## 2022-05-19 ENCOUNTER — Encounter: Payer: Self-pay | Admitting: Intensive Care

## 2022-05-19 ENCOUNTER — Emergency Department
Admission: EM | Admit: 2022-05-19 | Discharge: 2022-05-19 | Disposition: A | Discharge status: Home / Self Care | Payer: Self-pay | Attending: Emergency Medicine | Admitting: Emergency Medicine

## 2022-05-19 ENCOUNTER — Other Ambulatory Visit: Payer: Self-pay

## 2022-05-19 DIAGNOSIS — W3400XA Accidental discharge from unspecified firearms or gun, initial encounter: Secondary | ICD-10-CM | POA: Insufficient documentation

## 2022-05-19 DIAGNOSIS — S41102A Unspecified open wound of left upper arm, initial encounter: Secondary | ICD-10-CM | POA: Insufficient documentation

## 2022-05-19 DIAGNOSIS — R791 Abnormal coagulation profile: Secondary | ICD-10-CM | POA: Insufficient documentation

## 2022-05-19 DIAGNOSIS — S51012A Laceration without foreign body of left elbow, initial encounter: Secondary | ICD-10-CM | POA: Insufficient documentation

## 2022-05-19 DIAGNOSIS — Z23 Encounter for immunization: Secondary | ICD-10-CM | POA: Insufficient documentation

## 2022-05-19 LAB — COMPREHENSIVE METABOLIC PANEL
ALT: 35 U/L (ref 0–44)
AST: 31 U/L (ref 15–41)
Albumin: 4.6 g/dL (ref 3.5–5.0)
Alkaline Phosphatase: 49 U/L (ref 38–126)
Anion gap: 11 (ref 5–15)
BUN: 15 mg/dL (ref 6–20)
CO2: 26 mmol/L (ref 22–32)
Calcium: 9.2 mg/dL (ref 8.9–10.3)
Chloride: 102 mmol/L (ref 98–111)
Creatinine, Ser: 1.18 mg/dL (ref 0.61–1.24)
GFR, Estimated: >60 mL/min (ref >60)
Glucose, Bld: 108 mg/dL — ABNORMAL HIGH (ref 70–99)
Potassium: 3.7 mmol/L (ref 3.5–5.1)
Sodium: 139 mmol/L (ref 135–145)
Total Bilirubin: 1.2 mg/dL (ref 0.3–1.2)
Total Protein: 7.9 g/dL (ref 6.5–8.1)

## 2022-05-19 LAB — TYPE AND SCREEN
ABO/RH(D): O POS
Antibody Screen: NEGATIVE

## 2022-05-19 LAB — CBC
HCT: 46.6 % (ref 39.0–52.0)
Hemoglobin: 16.1 g/dL (ref 13.0–17.0)
MCH: 28.3 pg (ref 26.0–34.0)
MCHC: 34.5 g/dL (ref 30.0–36.0)
MCV: 81.9 fL (ref 80.0–100.0)
Platelets: 222 10*3/uL (ref 150–400)
RBC: 5.69 MIL/uL (ref 4.22–5.81)
RDW: 13.7 % (ref 11.5–15.5)
WBC: 7 10*3/uL (ref 4.0–10.5)
nRBC: 0 % (ref 0.0–0.2)

## 2022-05-19 LAB — PROTIME-INR
INR: 1 (ref 0.8–1.2)
Prothrombin Time: 13.4 seconds (ref 11.4–15.2)

## 2022-05-19 MED ORDER — CEPHALEXIN 500 MG PO CAPS: 500.0000 mg | ORAL_CAPSULE | Freq: Two times a day (BID) | ORAL | 0 refills | Status: DC

## 2022-05-19 MED ORDER — CEPHALEXIN 500 MG PO CAPS
500.0000 mg | ORAL_CAPSULE | Freq: Once | ORAL | Status: AC
  Administered 2022-05-19: 500 mg via ORAL
  Filled 2022-05-19: qty 1

## 2022-05-19 MED ORDER — FENTANYL CITRATE PF 50 MCG/ML IJ SOSY
50.0000 ug | PREFILLED_SYRINGE | Freq: Once | INTRAMUSCULAR | Status: AC
  Administered 2022-05-19: 50 ug via INTRAVENOUS
  Filled 2022-05-19: qty 1

## 2022-05-19 MED ORDER — TRAMADOL HCL 50 MG PO TABS: 50.0000 mg | ORAL_TABLET | Freq: Four times a day (QID) | ORAL | 0 refills | Status: AC | PRN

## 2022-05-19 MED ORDER — TETANUS-DIPHTH-ACELL PERTUSSIS 5-2.5-18.5 LF-MCG/0.5 IM SUSY
0.5000 mL | PREFILLED_SYRINGE | Freq: Once | INTRAMUSCULAR | Status: AC
  Administered 2022-05-19: 0.5 mL via INTRAMUSCULAR
  Filled 2022-05-19: qty 0.5

## 2022-05-19 MED ORDER — LIDOCAINE HCL (PF) 1 % IJ SOLN
10.0000 mL | Freq: Once | INTRAMUSCULAR | Status: AC
  Administered 2022-05-19: 10 mL
  Filled 2022-05-19: qty 10

## 2022-05-19 MED ORDER — IOHEXOL 350 MG/ML SOLN
100.0000 mL | Freq: Once | INTRAVENOUS | Status: AC | PRN
  Administered 2022-05-19: 100 mL via INTRAVENOUS

## 2022-05-19 NOTE — ED Provider Notes (Signed)
LACERATION REPAIR Performed by: Faythe Ghee Authorized by: Faythe Ghee Consent: Verbal consent obtained. Risks and benefits: risks, benefits and alternatives were discussed Consent given by: patient Patient identity confirmed: provided demographic data Prepped and Draped in normal sterile fashion Wound explored  Laceration Location: left elbow/arm  Laceration Length: 12cm  No Foreign Bodies seen or palpated  Anesthesia: local infiltration  Local anesthetic: lidocaine 1% w/o epinephrine  Anesthetic total: 10 ml  Irrigation method: syringe Amount of cleaning: standard  Skin closure: Simple  Number of sutures: 23  Technique: Simple suture, used 2-0 and 4-0 nylon  Patient tolerance: Patient tolerated the procedure well with no immediate complications.  Also performed by [facility] PA student under my supervision   Bartholomew Boards 05/19/22 Oletta Lamas, MD 05/19/22 2241

## 2022-05-19 NOTE — ED Notes (Signed)
Bandage placed to L arm.  Care instructions provided
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
## 2022-05-19 NOTE — Discharge Instructions (Signed)
Please follow-up with your doctor, urgent care, etc. in 7 days for suture removal.  Please take your antibiotics as prescribed twice daily for the next 7 days.  Please take your pain medication as needed but only as written.  Return to the emergency department for any signs of infection such as increased redness swelling pus or development of fever.
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
## 2022-06-04 ENCOUNTER — Emergency Department
Admission: EM | Admit: 2022-06-04 | Discharge: 2022-06-04 | Disposition: A | Discharge status: Home / Self Care | Payer: Self-pay | Attending: Emergency Medicine | Admitting: Emergency Medicine

## 2022-06-04 ENCOUNTER — Other Ambulatory Visit: Payer: Self-pay

## 2022-06-04 DIAGNOSIS — Z4802 Encounter for removal of sutures: Secondary | ICD-10-CM

## 2022-06-04 MED ORDER — DOXYCYCLINE MONOHYDRATE 100 MG PO TABS: 100.0000 mg | ORAL_TABLET | Freq: Two times a day (BID) | ORAL | 0 refills | Status: AC

## 2022-06-04 NOTE — Discharge Instructions (Signed)
-  Please take the full course of the antibiotics as prescribed.  -Wash the wound daily in the shower with soap and water.  Recommend applying a topical antibiotic ointment daily and keeping covered with large Band-Aids and/or sterile gauze dressings.  -Return to the emergency department anytime if you begin to experience any new or worsening symptoms.

## 2022-06-04 NOTE — ED Triage Notes (Signed)
Pt comes with c/o suture removal. Pt states no other complaints.

## 2022-06-04 NOTE — ED Notes (Signed)
Discharged by provider. Moshe Cipro, P.A.)
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
## 2023-04-25 ENCOUNTER — Ambulatory Visit: Payer: Self-pay

## 2023-06-08 ENCOUNTER — Ambulatory Visit: Payer: Self-pay

## 2023-07-12 ENCOUNTER — Encounter: Payer: Self-pay | Admitting: Family Medicine

## 2023-07-12 ENCOUNTER — Ambulatory Visit: Payer: Self-pay | Admitting: Family Medicine

## 2023-07-12 DIAGNOSIS — Z113 Encounter for screening for infections with a predominantly sexual mode of transmission: Secondary | ICD-10-CM

## 2023-07-12 LAB — HM HIV SCREENING LAB: HM HIV Screening: NEGATIVE

## 2023-07-12 NOTE — Progress Notes (Signed)
[facility] Department STI clinic/screening visit  Subjective:  Gregory Trujillo is a 37 y.o. male being seen today for an STI screening visit. The patient reports they do not have symptoms.    Patient has the following medical conditions:  There are no problems to display for this patient.    Chief Complaint  Patient presents with   SEXUALLY TRANSMITTED DISEASE    HPI  Patient reports to clinic for STI testing- asymptomatic  Last HIV test per patient/review of record was  Lab Results  Component Value Date   HMHIVSCREEN Negative - Validated 03/27/2020   No results found for: "HIV"  Last HEPC test per patient/review of record was  Lab Results  Component Value Date   HMHEPCSCREEN Negative-Validated 03/27/2020   No components found for: "HEPC"   Last HEPB test per patient/review of record was No components found for: "HMHEPBSCREEN" No components found for: "HEPC"   Does the patient or their partner desires a pregnancy in the next year? No  Screening for MPX risk: Does the patient have an unexplained rash? No Is the patient MSM? No Does the patient endorse multiple sex partners or anonymous sex partners? Yes Did the patient have close or sexual contact with a person diagnosed with MPX? No Has the patient traveled outside the [place] where MPX is endemic? No Is there a high clinical suspicion for MPX-- evidenced by one of the following No  -Unlikely to be chickenpox  -Lymphadenopathy  -Rash that present in same phase of evolution on any given body part   See flowsheet for further details and programmatic requirements.   Immunization History  Administered Date(s) Administered   Tdap 09/11/2021, 05/19/2022     The following portions of the patient's history were reviewed and updated as appropriate: allergies, current medications, past medical history, past social history, past surgical history and problem list.  Objective:  There were no vitals filed for this  visit.  Physical Exam Vitals and nursing note reviewed.  Constitutional:      Appearance: Normal appearance.  HENT:     Head: Normocephalic and atraumatic.     Mouth/Throat:     Mouth: Mucous membranes are moist.     Pharynx: No oropharyngeal exudate or posterior oropharyngeal erythema.  Eyes:     General:        Right eye: No discharge.        Left eye: No discharge.     Conjunctiva/sclera:     Right eye: Right conjunctiva is not injected. No exudate.    Left eye: Left conjunctiva is not injected. No exudate. Pulmonary:     Effort: Pulmonary effort is normal.  Abdominal:     General: Abdomen is flat.     Palpations: Abdomen is soft. There is no hepatomegaly or mass.     Tenderness: There is no abdominal tenderness. There is no rebound.  Genitourinary:    Comments: Declined genital exam- asymptomatic Lymphadenopathy:     Cervical: No cervical adenopathy.     Upper Body:     Right upper body: No supraclavicular or axillary adenopathy.     Left upper body: No supraclavicular or axillary adenopathy.  Skin:    General: Skin is warm and dry.  Neurological:     Mental Status: He is alert and oriented to person, place, and time.       Assessment and Plan:  Gregory Trujillo is a 37 y.o. male presenting to the [facility] Department for STI  screening  1. Screening for venereal disease  - HIV [place] [facility] - Syphilis Serology, [place] [facility] - Chlamydia/GC NAA, Confirmation   Patient does not have STI symptoms Patient accepted all screenings including  urine GC/Chlamydia, and blood work for HIV/Syphilis. Patient meets criteria for HepB screening? No. Ordered? not applicable Patient meets criteria for HepC screening? No. Ordered? not applicable Recommended condom use with all sex Discussed importance of condom use for STI prevent  Treat positive test results per standing order. Discussed time line for State Lab results and that patient will be called with  positive results and encouraged patient to call if he had not heard in 2 weeks Recommended repeat testing in 3 months with positive results. Recommended returning for continued or worsening symptoms.   Return if symptoms worsen or fail to improve, for STI screening.  No future appointments.  Lenice Llamas, [place]

## 2023-07-15 LAB — CHLAMYDIA/GC NAA, CONFIRMATION
Chlamydia trachomatis, NAA: NEGATIVE
Neisseria gonorrhoeae, NAA: NEGATIVE
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
## 2023-10-02 ENCOUNTER — Ambulatory Visit: Payer: Self-pay | Admitting: Family Medicine

## 2023-10-02 ENCOUNTER — Encounter: Payer: Self-pay | Admitting: Family Medicine

## 2023-10-02 DIAGNOSIS — Z113 Encounter for screening for infections with a predominantly sexual mode of transmission: Secondary | ICD-10-CM

## 2023-10-02 DIAGNOSIS — Z202 Contact with and (suspected) exposure to infections with a predominantly sexual mode of transmission: Secondary | ICD-10-CM

## 2023-10-02 LAB — HM HIV SCREENING LAB: HM HIV Screening: NEGATIVE

## 2023-10-02 MED ORDER — METRONIDAZOLE 500 MG PO TABS: 2000.0000 mg | ORAL_TABLET | Freq: Once | ORAL | Status: AC

## 2023-10-02 NOTE — Progress Notes (Signed)
Pt here for STI screening and treatment as contact to [facility].  No in house labs performed.  Metronidazole 500mg  #4 dispensed to patient.  Counseled pt re medication, side effects, plan of care and when to contact clinic with questions or concerns.  Verbalizes understanding.  Condoms given.-Collins Scotland, RN

## 2023-10-02 NOTE — Progress Notes (Signed)
[facility] Department STI clinic [address], [address] [place] [address] Main phone: [phone]  STI screening visit  Subjective:  Gregory Trujillo is a 38 y.o. male being seen today for an STI screening visit. The patient reports they do not have symptoms.    Patient has the following medical conditions:  There are no active problems to display for this patient.   Chief Complaint  Patient presents with   SEXUALLY TRANSMITTED DISEASE    STI screening-partner w/TRICH-denies symptoms    HPI HPI Patient reports to clinic for STI screening and as a contact to trich  Last HIV test per patient/review of record was  Lab Results  Component Value Date   HMHIVSCREEN Negative - Validated 07/12/2023   No results found for: "HIV"  Last HEPC test per patient/review of record was  Lab Results  Component Value Date   HMHEPCSCREEN Negative-Validated 03/27/2020   No components found for: "HEPC"   Last HEPB test per patient/review of record was No components found for: "HMHEPBSCREEN" No components found for: "HEPC"   Does the patient or their partner desires a pregnancy in the next year? No  Screening for MPX risk: Does the patient have an unexplained rash? No Is the patient MSM? No Does the patient endorse multiple sex partners or anonymous sex partners? Yes Did the patient have close or sexual contact with a person diagnosed with MPX? No Has the patient traveled outside the [place] where MPX is endemic? No Is there a high clinical suspicion for MPX-- evidenced by one of the following No  -Unlikely to be chickenpox  -Lymphadenopathy  -Rash that present in same phase of evolution on any given body part   See flowsheet for further details and programmatic requirements.   Immunization History  Administered Date(s) Administered   Tdap 09/11/2021, 05/19/2022     The following portions of the patient's history were reviewed and updated as appropriate:  allergies, current medications, past medical history, past social history, past surgical history and problem list.  Objective:  There were no vitals filed for this visit.  Physical Exam Vitals and nursing note reviewed.  Constitutional:      Appearance: Normal appearance.  HENT:     Head: Normocephalic and atraumatic.     Mouth/Throat:     Mouth: Mucous membranes are moist.     Pharynx: No oropharyngeal exudate or posterior oropharyngeal erythema.  Eyes:     General:        Right eye: No discharge.        Left eye: No discharge.     Conjunctiva/sclera:     Right eye: Right conjunctiva is not injected. No exudate.    Left eye: Left conjunctiva is not injected. No exudate. Pulmonary:     Effort: Pulmonary effort is normal.  Abdominal:     General: Abdomen is flat.     Palpations: Abdomen is soft. There is no hepatomegaly or mass.     Tenderness: There is no abdominal tenderness. There is no rebound.  Genitourinary:    Comments: Declined genital exam- asymptomatic Lymphadenopathy:     Cervical: No cervical adenopathy.     Upper Body:     Right upper body: No supraclavicular or axillary adenopathy.     Left upper body: No supraclavicular or axillary adenopathy.  Skin:    General: Skin is warm and dry.  Neurological:     Mental Status: He is alert and oriented to person, place, and time.  Assessment and Plan:  Gregory Trujillo is a 38 y.o. male presenting to the [facility] Department for STI screening  1. Screening for venereal disease (Primary)  - HIV [place] [facility] - Syphilis Serology, [place] [facility] - Chlamydia/GC NAA, Confirmation  2. Exposure to trichomonas  - metroNIDAZOLE (FLAGYL) 500 MG tablet; Take 4 tablets (2,000 mg total) by mouth once for 1 dose.   Patient does not have STI symptoms Patient accepted all screenings including  urine GC/Chlamydia, and blood work for HIV/Syphilis. Patient meets criteria for HepB screening? No. Ordered? not  applicable Patient meets criteria for HepC screening? No. Ordered? not applicable Recommended condom use with all sex Discussed importance of condom use for STI prevention  Treat positive test results per standing order. Discussed time line for State Lab results and that patient will be called with positive results and encouraged patient to call if he had not heard in 2 weeks Recommended repeat testing in 3 months with positive results. Recommended returning for continued or worsening symptoms.   Return if symptoms worsen or fail to improve, for STI screening.  No future appointments.  Lenice Llamas, [place]

## 2023-10-04 LAB — CHLAMYDIA/GC NAA, CONFIRMATION
Chlamydia trachomatis, NAA: NEGATIVE
Neisseria gonorrhoeae, NAA: NEGATIVE

## 2024-02-21 ENCOUNTER — Ambulatory Visit: Payer: Self-pay | Admitting: Nurse Practitioner

## 2024-02-21 ENCOUNTER — Encounter: Payer: Self-pay | Admitting: Nurse Practitioner

## 2024-02-21 DIAGNOSIS — Z113 Encounter for screening for infections with a predominantly sexual mode of transmission: Secondary | ICD-10-CM

## 2024-02-21 LAB — HM HEPATITIS C SCREENING LAB: HM Hepatitis Screen: NEGATIVE

## 2024-02-21 LAB — HM HIV SCREENING LAB: HM HIV Screening: NEGATIVE

## 2024-02-21 NOTE — Progress Notes (Deleted)
 Pt is here for STD screening. Condoms declined. Sonda Primes, RN.

## 2024-02-21 NOTE — Progress Notes (Signed)
 Patient here for STD screening. Condoms declined. All questions answered and verbalizes understanding.   Clare Critchley, RN
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
## 2024-02-22 LAB — HBV ANTIGEN/ANTIBODY STATE LAB
Hep B Core Total Ab: NONREACTIVE
Hep B S Ab: NONREACTIVE
Hepatitis B Surface Antigen: NONREACTIVE

## 2024-02-26 LAB — CHLAMYDIA/GC NAA, CONFIRMATION
Chlamydia trachomatis, NAA: NEGATIVE
Neisseria gonorrhoeae, NAA: NEGATIVE

## 2024-02-27 LAB — GONOCOCCUS CULTURE

## 2024-03-01 ENCOUNTER — Encounter: Payer: Self-pay | Admitting: Nurse Practitioner
